# Patient Record
Sex: Male | Born: 1990
Health system: Southern US, Community
[De-identification: ages and names within clinical notes are randomized; demographics above are authoritative.]

## PROBLEM LIST (undated history)

## (undated) DIAGNOSIS — J342 Deviated nasal septum: Secondary | ICD-10-CM

## (undated) HISTORY — DX: Deviated nasal septum: J34.2

---
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
## 2015-10-05 DIAGNOSIS — F3342 Major depressive disorder, recurrent, in full remission: Secondary | ICD-10-CM | POA: Diagnosis not present

## 2016-01-06 DIAGNOSIS — Z23 Encounter for immunization: Secondary | ICD-10-CM | POA: Diagnosis not present

## 2016-03-28 DIAGNOSIS — F3342 Major depressive disorder, recurrent, in full remission: Secondary | ICD-10-CM | POA: Diagnosis not present

## 2016-10-25 DIAGNOSIS — F3342 Major depressive disorder, recurrent, in full remission: Secondary | ICD-10-CM | POA: Diagnosis not present

## 2017-02-23 DIAGNOSIS — L819 Disorder of pigmentation, unspecified: Secondary | ICD-10-CM | POA: Diagnosis not present

## 2017-02-23 DIAGNOSIS — F3342 Major depressive disorder, recurrent, in full remission: Secondary | ICD-10-CM | POA: Diagnosis not present

## 2017-02-23 DIAGNOSIS — B354 Tinea corporis: Secondary | ICD-10-CM | POA: Diagnosis not present

## 2017-04-11 DIAGNOSIS — F3342 Major depressive disorder, recurrent, in full remission: Secondary | ICD-10-CM | POA: Diagnosis not present

## 2017-09-06 DIAGNOSIS — Z0000 Encounter for general adult medical examination without abnormal findings: Secondary | ICD-10-CM | POA: Diagnosis not present

## 2017-09-20 DIAGNOSIS — R740 Nonspecific elevation of levels of transaminase and lactic acid dehydrogenase [LDH]: Secondary | ICD-10-CM | POA: Diagnosis not present

## 2017-09-25 ENCOUNTER — Other Ambulatory Visit: Payer: Self-pay | Admitting: Family Medicine

## 2017-09-25 DIAGNOSIS — R7401 Elevation of levels of liver transaminase levels: Secondary | ICD-10-CM

## 2017-09-25 DIAGNOSIS — R740 Nonspecific elevation of levels of transaminase and lactic acid dehydrogenase [LDH]: Principal | ICD-10-CM

## 2017-10-17 ENCOUNTER — Ambulatory Visit
Admission: RE | Admit: 2017-10-17 | Discharge: 2017-10-17 | Disposition: A | Discharge status: Home / Self Care | Payer: BLUE CROSS/BLUE SHIELD | Source: Ambulatory Visit | Attending: Family Medicine | Admitting: Family Medicine

## 2017-10-17 DIAGNOSIS — R740 Nonspecific elevation of levels of transaminase and lactic acid dehydrogenase [LDH]: Principal | ICD-10-CM

## 2017-10-17 DIAGNOSIS — R7401 Elevation of levels of liver transaminase levels: Secondary | ICD-10-CM

## 2017-11-26 DIAGNOSIS — J209 Acute bronchitis, unspecified: Secondary | ICD-10-CM | POA: Diagnosis not present

## 2018-03-10 DIAGNOSIS — F3342 Major depressive disorder, recurrent, in full remission: Secondary | ICD-10-CM | POA: Insufficient documentation

## 2018-03-10 DIAGNOSIS — F900 Attention-deficit hyperactivity disorder, predominantly inattentive type: Secondary | ICD-10-CM

## 2018-04-11 ENCOUNTER — Encounter: Payer: Self-pay | Admitting: Psychiatry

## 2018-04-11 ENCOUNTER — Ambulatory Visit: Payer: BLUE CROSS/BLUE SHIELD | Admitting: Psychiatry

## 2018-04-11 VITALS — BP 129/77 | HR 72 | Ht 74.0 in | Wt 200.0 lb

## 2018-04-11 DIAGNOSIS — F3342 Major depressive disorder, recurrent, in full remission: Secondary | ICD-10-CM

## 2018-04-11 DIAGNOSIS — F411 Generalized anxiety disorder: Secondary | ICD-10-CM | POA: Diagnosis not present

## 2018-04-11 MED ORDER — BUPROPION HCL 75 MG PO TABS: 75.0000 mg | ORAL_TABLET | Freq: Every day | ORAL | 1 refills | Status: DC

## 2018-04-11 NOTE — Progress Notes (Signed)
[facility]  Patient ID: ARMANDO MCEVOY,  MRN: [id number]  PCP: Iva Boop, MD  Date of Evaluation: 04/11/2018 Time spent:10 minutes  Chief Complaint:  Chief Complaint    Anxiety; Depression; Follow-up      HISTORY/CURRENT STATUS: Stanford Breed is seen individually face-to-face with consent not collateral for psychiatric interview and exam in 1 year evaluation and management of recurrent major depression in remission with previous provisional differential diagnosis of inattentive ADHD.  In the interim, patient is observing and now reporting that he has frequent anxiety experienced as a tightness in the chest with excessive sweating and uneasy apprehension that something will go wrong.  He is taking on more responsibility at work and very busy.  He has experienced the graduation of his wife from [employer] now working since August so that they are in position to consider a pregnancy for next year.  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  However he does allow discussion today of the option of 150 mg XL Wellbutrin or the addition of low dose Lexapro to his Wellbutrin to optimize coverage and relief of anxiety.  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  Private life is polished and maintained toward wellness, happiness, and rewarding relationships, activities, and employment including with family.  As in past, he refuses any medication changes currently. Anxiety  Presents for initial visit. Onset was more than 5 years ago. The problem has been waxing and waning. Symptoms include chest pain, decreased concentration, dry mouth, excessive worry, muscle tension, nervous/anxious behavior, palpitations and shortness of breath. Patient reports no suicidal ideas. Primary symptoms comment:  Diaphoresis. Symptoms occur most days. The severity of symptoms is moderate. The symptoms are aggravated by work stress and social activities. The patient sleeps 6 hours per night. The quality of sleep is fair.   Risk factors include family history and a major life event. His past medical history is significant for anxiety/panic attacks and depression. There is no history of bipolar disorder or suicide attempts. Past treatments include non-SSRI antidepressants and lifestyle changes. The treatment provided mild relief. Compliance with prior treatments has been variable. Prior compliance problems include medication issues and difficulty with treatment plan.    Individual Medical History/ Review of Systems: Changes? :No   Allergies: Penicillins  Current Medications:  Current Outpatient Medications:  .  buPROPion (WELLBUTRIN) 75 MG tablet, Take 75 mg by mouth every morning., Disp: , Rfl:  Medication Side Effects: none  Family Medical/ Social History: Changes? No  MENTAL HEALTH EXAM: Muscle strengths and tone 5/5, postural reflexes and gait 0/0, and AIMS = 0. Blood pressure 129/77, pulse 72, height 6\' 2"  (1.88 m), weight 200 lb (90.7 kg).Body mass index is 25.68 kg/m.  General Appearance: Casual, Guarded and Obese and meticulous  Eye Contact:  Good  Speech:  Blocked and Clear and Coherent  Volume:  Normal  Mood:  Anxious, Euthymic and Worthless  Affect:  Constricted and Anxious  Thought Process:  Goal Directed and Linear  Orientation:  Full (Time, Place, and Person)  Thought Content: Obsessions   Suicidal Thoughts:  No  Homicidal Thoughts:  No  Memory:  Immediate;   Good Remote;   Good  Judgement:  Good  Insight:  Fair  Psychomotor Activity:  Normal  Concentration:  Concentration: Fair and Attention Span: Fair  Recall:  Good  Fund of Knowledge: Good  Language: Good  Assets:  Leisure Time Physical Health Resilience  ADL's:  Intact  Cognition: WNL  Prognosis:  Good     DIAGNOSES:    ICD-10-CM   1. Major depressive disorder, recurrent episode, in full remission (HCC) F33.42   2. Generalized anxiety disorder F41.1     Receiving Psychotherapy: No    RECOMMENDATIONS: The patient's insight faciltates differential diagnosis clarification that inattention and distraction symptoms of the past may have been generalized anxiety rather than ADHD, including as a risk factor for history of depression.  Wellbutrin alone may help depression and focus more than anxiety, except generalized anxiety may be improved though not optimally with an 8-hour low-dose coverage.  We process comparison of 75 mg IR over 8 hours with 150 mg XL over 24 hours.  Marlene Bast is doing well with an effectively established life otherwise.  He is E scribed bupropion 75 mg IR daily after breakfast #90 with 1 refill sent to [facility] on [address].  He returns in 6 months to reassess anxiety and its treatment.   Chauncey Mann, MD

## 2018-10-10 ENCOUNTER — Encounter: Payer: Self-pay | Admitting: Psychiatry

## 2018-10-10 ENCOUNTER — Ambulatory Visit (INDEPENDENT_AMBULATORY_CARE_PROVIDER_SITE_OTHER): Payer: BC Managed Care – PPO | Admitting: Psychiatry

## 2018-10-10 ENCOUNTER — Other Ambulatory Visit: Payer: Self-pay

## 2018-10-10 VITALS — Ht 74.0 in | Wt 193.0 lb

## 2018-10-10 DIAGNOSIS — F3342 Major depressive disorder, recurrent, in full remission: Secondary | ICD-10-CM

## 2018-10-10 DIAGNOSIS — F411 Generalized anxiety disorder: Secondary | ICD-10-CM | POA: Diagnosis not present

## 2018-10-10 MED ORDER — BUPROPION HCL ER (XL) 150 MG PO TB24: 150.0000 mg | ORAL_TABLET | Freq: Every day | ORAL | 1 refills | Status: DC

## 2018-10-10 NOTE — Progress Notes (Signed)
[facility]  Patient ID: James Woodward,  MRN: [id number]  PCP: Dineen Kid, MD  Date of Evaluation: 10/10/2018 Time spent:10 minutes from 0905 to 0915 Chief Complaint:  Chief Complaint    Depression; Anxiety; Stress      HISTORY/CURRENT STATUS: James Woodward is seen onsite in office face-to-face with consent individually with epic collateral for psychiatric interview and exam in 70-month evaluation and management of depression and anxiety.  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  He returns in 6 instead of 12 months from last appointment when he had some brief consideration of the extended release Wellbutrin and doubling the dose but refused to make any changes in tax season.  He again complains of more anxiety clarifying some OCD symptoms that concern wife but are more clinically cluster C traits.  Wife attempts to assist him with diet and exercise components, though she is concerned he may have OCD, and he is most concerned about the pandemic of coronavirus.  He presents preferring Wellbutrin increase discissed the last appointment that he declines then.  He declines today to consider Lexapro, Prozac, or Neurontin, suggesting he is taking Neurontin in the past for headaches.  He has no suicidality, delirium, mania, or psychosis.   Anxiety        Presents for increased symptoms of onset was more than 5 years ago. The problem has been waxing and waning. Symptoms include chest pain, decreased concentration, dry mouth, excessive worry, muscle tension, nervous/anxious behavior,increased eating, weight loss, palpitations and shortness of breath. Patient reports no suicidal ideas. Primary symptoms comment: Diaphoresis. Symptoms occur most days. The severity of symptoms is moderate. The symptoms are aggravated by work stress and social activities. The patient sleeps 6 hours per night. The quality of sleep is good.   Individual  Medical History/ Review of Systems: Changes? :No But he may have taken Neurontin in the past for headaches.  Allergies: Penicillins  Current Medications:  Current Outpatient Medications:  .  buPROPion (WELLBUTRIN XL) 150 MG 24 hr tablet, Take 1 tablet (150 mg total) by mouth daily after breakfast., Disp: 90 tablet, Rfl: 1   Medication Side Effects: none  Family Medical/ Social History: Changes? Yes wife as a therapist including yoga and holistic diet is concerned still that he may have OCD.  MENTAL HEALTH EXAM:  Height 6\' 2"  (1.88 m), weight 193 lb (87.5 kg).Body mass index is 24.78 kg/m.  As not present in office today  General Appearance: N/A  Eye Contact:  N/A  Speech:  Clear and Coherent, Normal Rate and Talkative  Volume:  Normal  Mood:  Anxious and Euthymic  Affect:  Congruent, Full Range and Anxious  Thought Process:  Coherent, Goal Directed and Irrelevant  Orientation:  Full (Time, Place, and Person)  Thought Content: Obsessions   Suicidal Thoughts:  No  Homicidal Thoughts:  No  Memory:  Immediate;   Good Remote;   Good  Judgement:  Good  Insight:  Fair  Psychomotor Activity:  Normal and Mannerisms  Concentration:  Concentration: Fair and Attention Span: Good  Recall:  Good  Fund of Knowledge: Good  Language: Good  Assets:  Desire for Improvement Physical Health Vocational/Educational  ADL's:  Intact  Cognition: WNL  Prognosis:  Good    DIAGNOSES:    ICD-10-CM   1. Generalized anxiety disorder  F41.1 buPROPion (WELLBUTRIN XL) 150 MG 24 hr tablet  2. Major depressive disorder, recurrent episode, in  full remission (HCC)  F33.42 buPROPion (WELLBUTRIN XL) 150 MG 24 hr tablet    Receiving Psychotherapy: No    RECOMMENDATIONS: Wellbutrin E scription is changed from the 75 mg IR every morning to 150 mg XL every morning sent as #90 with 1 refill to [facility] [address] for depression and anxiety.  He declines Lexapro, Prozac, or gabapentin at this time but he  agrees to follow-up in 6 months.  He allows reeducation on symptom treatment matching including for cognitive behavioral social skills, frustration management, and problem solving.  He has previously taken Wellbutrin up to 300 mg XL and tolerated well but tends to perfectionistically keep his dose as low as possible.    Virtual Visit via Video Note  I connected with James Woodward on 10/10/18 at  9:00 AM EDT by a video enabled tBertell Mariaelemedicine application and verified that I am speaking with the correct person using two identifiers.  Location: Patient: Individually at home residence Provider: [facility] office   I discussed the limitations of evaluation and management by telemedicine and the availability of in person appointments. The patient expressed understanding and agreed to proceed.  History of Present Illness: 6048-month evaluation and management address depression and anxiety.  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  He returns in 6 instead of 12 months from last appointment when he had some brief consideration of the extended release Wellbutrin and doubling the dose but refused to make any changes in tax season.    Observations/Objective: Mood:  Anxious and Euthymic  Affect:  Congruent, Full Range and Anxious  Thought Process:  Coherent, Goal Directed and Irrelevant  Orientation:  Full (Time, Place, and Person)  Thought Content: Obsessions    Assessment and Plan: Wellbutrin E scription is changed from the 75 mg IR every morning to 150 mg XL every morning sent as #90 with 1 refill to [facility] for depression and anxiety.  He declines Lexapro, Prozac, or gabapentin at this time b  Follow Up Instructions: He agrees to follow-up in 6 months.  He allows reeducation on symptom treatment matching including for cognitive behavioral social skills, frustration management, and problem solving.  He has  previously taken Wellbutrin up to 300 mg XL and tolerated well but tends to perfectionistically keep his dose as low as possible.     I discussed the assessment and treatment plan with the patient. The patient was provided an opportunity to ask questions and all were answered. The patient agreed with the plan and demonstrated an understanding of the instructions.   The patient was advised to call back or seek an in-person evaluation if the symptoms worsen or if the condition fails to improve as anticipated.  I provided 10 minutes of non-face-to-face time during this encounter. [employer] meeting #[phone] Meeting password:  [email or web address]  Chauncey MannGlenn E Fredonia Casalino, MD   Chauncey MannGlenn E Cornel Werber, MD

## 2018-10-12 ENCOUNTER — Other Ambulatory Visit: Payer: Self-pay | Admitting: Psychiatry

## 2018-10-12 DIAGNOSIS — F3342 Major depressive disorder, recurrent, in full remission: Secondary | ICD-10-CM

## 2018-10-12 DIAGNOSIS — F411 Generalized anxiety disorder: Secondary | ICD-10-CM

## 2018-10-18 DIAGNOSIS — R1030 Lower abdominal pain, unspecified: Secondary | ICD-10-CM | POA: Diagnosis not present

## 2018-10-18 DIAGNOSIS — R319 Hematuria, unspecified: Secondary | ICD-10-CM | POA: Diagnosis not present

## 2018-11-14 DIAGNOSIS — R319 Hematuria, unspecified: Secondary | ICD-10-CM | POA: Diagnosis not present

## 2018-12-13 DIAGNOSIS — Z23 Encounter for immunization: Secondary | ICD-10-CM | POA: Diagnosis not present

## 2018-12-13 DIAGNOSIS — R102 Pelvic and perineal pain: Secondary | ICD-10-CM | POA: Diagnosis not present

## 2019-03-26 DIAGNOSIS — Z0000 Encounter for general adult medical examination without abnormal findings: Secondary | ICD-10-CM | POA: Diagnosis not present

## 2019-04-06 ENCOUNTER — Other Ambulatory Visit: Payer: Self-pay | Admitting: Psychiatry

## 2019-04-06 DIAGNOSIS — F3342 Major depressive disorder, recurrent, in full remission: Secondary | ICD-10-CM

## 2019-04-06 DIAGNOSIS — F411 Generalized anxiety disorder: Secondary | ICD-10-CM

## 2019-07-04 ENCOUNTER — Other Ambulatory Visit: Payer: Self-pay | Admitting: Psychiatry

## 2019-07-04 DIAGNOSIS — F3342 Major depressive disorder, recurrent, in full remission: Secondary | ICD-10-CM

## 2019-07-04 DIAGNOSIS — F411 Generalized anxiety disorder: Secondary | ICD-10-CM

## 2019-07-07 NOTE — Telephone Encounter (Signed)
Last apt 10/10/2018

## 2019-10-03 ENCOUNTER — Other Ambulatory Visit: Payer: Self-pay | Admitting: Psychiatry

## 2019-10-03 DIAGNOSIS — F411 Generalized anxiety disorder: Secondary | ICD-10-CM

## 2019-10-03 DIAGNOSIS — F3342 Major depressive disorder, recurrent, in full remission: Secondary | ICD-10-CM

## 2019-10-03 NOTE — Telephone Encounter (Signed)
Patient has not been in since 09/2018, due back Feb 21

## 2019-11-06 ENCOUNTER — Other Ambulatory Visit: Payer: Self-pay | Admitting: Psychiatry

## 2019-11-06 DIAGNOSIS — F411 Generalized anxiety disorder: Secondary | ICD-10-CM

## 2019-11-06 DIAGNOSIS — F3342 Major depressive disorder, recurrent, in full remission: Secondary | ICD-10-CM

## 2019-11-06 NOTE — Telephone Encounter (Signed)
No follow up since 09/2018

## 2019-12-09 ENCOUNTER — Other Ambulatory Visit: Payer: Self-pay | Admitting: Psychiatry

## 2019-12-09 DIAGNOSIS — F411 Generalized anxiety disorder: Secondary | ICD-10-CM

## 2019-12-09 DIAGNOSIS — F3342 Major depressive disorder, recurrent, in full remission: Secondary | ICD-10-CM

## 2019-12-09 NOTE — Telephone Encounter (Signed)
Last apt over a year ago 09/2018

## 2019-12-17 ENCOUNTER — Encounter: Payer: Self-pay | Admitting: Psychiatry

## 2020-01-08 ENCOUNTER — Other Ambulatory Visit: Payer: Self-pay

## 2020-01-08 ENCOUNTER — Other Ambulatory Visit: Payer: Self-pay | Admitting: Psychiatry

## 2020-01-08 ENCOUNTER — Telehealth: Payer: Self-pay | Admitting: Psychiatry

## 2020-01-08 DIAGNOSIS — F411 Generalized anxiety disorder: Secondary | ICD-10-CM

## 2020-01-08 DIAGNOSIS — F3342 Major depressive disorder, recurrent, in full remission: Secondary | ICD-10-CM

## 2020-01-08 MED ORDER — BUPROPION HCL ER (XL) 150 MG PO TB24: ORAL_TABLET | ORAL | 0 refills | Status: DC

## 2020-01-08 NOTE — Telephone Encounter (Signed)
Pt left a message stating that he needs a refill on his wellbutrin xl 150 mg He has an appt on 11/16. At the [facility] on [address]

## 2020-01-08 NOTE — Telephone Encounter (Signed)
RX sent

## 2020-01-13 ENCOUNTER — Ambulatory Visit (INDEPENDENT_AMBULATORY_CARE_PROVIDER_SITE_OTHER): Payer: 59 | Admitting: Psychiatry

## 2020-01-13 ENCOUNTER — Encounter: Payer: Self-pay | Admitting: Psychiatry

## 2020-01-13 ENCOUNTER — Other Ambulatory Visit: Payer: Self-pay

## 2020-01-13 VITALS — Ht 74.0 in | Wt 203.0 lb

## 2020-01-13 DIAGNOSIS — F411 Generalized anxiety disorder: Secondary | ICD-10-CM | POA: Diagnosis not present

## 2020-01-13 DIAGNOSIS — F3342 Major depressive disorder, recurrent, in full remission: Secondary | ICD-10-CM

## 2020-01-13 MED ORDER — DESVENLAFAXINE SUCCINATE ER 50 MG PO TB24: 50.0000 mg | ORAL_TABLET | Freq: Every day | ORAL | 2 refills | Status: DC

## 2020-01-13 NOTE — Progress Notes (Signed)
[facility]  Patient ID: James Woodward,  MRN: [id number]  PCP: Iva Boop, MD  Date of Evaluation: 01/13/2020 Time spent:25 minutes from 0920 to 0945  Chief Complaint:  Chief Complaint    Anxiety; Panic Attack; Depression      HISTORY/CURRENT STATUS: James Woodward is seen onsite in office 25 minutes face-to-face individually with consent with epic collateral for psychiatric interview and exam in 29-month evaluation and management of generalized anxiety now also having panic attacks though with major depression remaining in remission, being 9 months overdue for follow up.  James Woodward reviews again the family history of mother having OCD, sister ADHD and social anxiety, and father depression.  James Woodward had a job change last winter approximately 5 months after last appointment calling for update renewal of his continued XL Wellbutrin started last August having always preferred the 75 mg IR prior to switching to the XL last appointment.  However despite the increase in dose of medication, he finds that wife delivering their first baby in July and several deaths of relatives in the interim have resulted in several panic attacks over the last 6 to 8 months which he manages with breathing skills and yoga and meditation.  However he now is willing to consider the change in medication to cover prevention of generalized anxiety now with panic as discussed in the past.  His limited symptom panic is more inherent to his generalized anxiety thus far, but emerging panic disorder is certainly to be considered.  Depression has not recurred again.  He has a several week supply of Wellbutrin from that phoned in as #30 on 01/08/2020.  As we review all options such as Lexapro or Prozac discussed last appointment being added to his Wellbutrin or the addition of Neurontin, he prefers to switch from Wellbutrin to a single agent we discuss such as Pristiq.  He is not manic, psychotic, delirious or  suicidal.   Anxiety Presents for follow-up visit. Symptoms include excessive worry, hyperventilation, muscle tension, nervous/anxious behavior, panic, restlessness and shortness of breath. Patient reports no confusion, decreased concentration, depressed mood, insomnia, irritability or suicidal ideas. Symptoms occur most days. The severity of symptoms is causing significant distress and moderate. The quality of sleep is good. Nighttime awakenings: none.   Compliance with medications is 51-75%.    Individual Medical History/ Review of Systems: Changes? :Yes  weight loss of 7 pounds at last appointment and is now back up to 10 pounds.  Allergies: Penicillins  Current Medications:  Current Outpatient Medications:  .  desvenlafaxine (PRISTIQ) 50 MG 24 hr tablet, Take 1 tablet (50 mg total) by mouth daily after breakfast., Disp: 90 tablet, Rfl: 2   Medication Side Effects: none  Family Medical/ Social History: Changes? No though noting again mother having OCD, sister ADHD and social anxiety, and father depression. Wife as provider of therapeutic yoga and holistic diet is concerned still that he may have OCD.  MENTAL HEALTH EXAM:  Height 6\' 2"  (1.88 m), weight 203 lb (92.1 kg).Body mass index is 26.06 kg/m. Muscle strengths and tone 5/5, postural reflexes and gait 0/0, and AIMS = 0.  General Appearance: Casual, Meticulous and Well Groomed  Eye Contact:  Good  Speech:  Clear and Coherent, Normal Rate and Talkative  Volume:  Normal  Mood:  Anxious and Euthymic  Affect:  Congruent, Full Range and Anxious  Thought Process:  Coherent, Goal Directed, Irrelevant and Descriptions of Associations: Circumstantial  Orientation:  Full (Time, Place, and Person)  Thought Content: Obsessions  and Rumination   Suicidal Thoughts:  No  Homicidal Thoughts:  No  Memory:  Immediate;   Good Remote;   Good  Judgement:  Good  Insight:  Fair  Psychomotor Activity:  Normal and Mannerisms  Concentration:   Concentration: Fair and Attention Span: Good  Recall:  Good  Fund of Knowledge: Good  Language: Good  Assets:  Desire for Improvement Physical Health Resilience Vocational/Educational  ADL's:  Intact  Cognition: WNL  Prognosis:  Good    DIAGNOSES:    ICD-10-CM   1. Generalized anxiety disorder  F41.1 desvenlafaxine (PRISTIQ) 50 MG 24 hr tablet  2. Major depressive disorder, recurrent episode, in full remission (HCC)  F33.42 desvenlafaxine (PRISTIQ) 50 MG 24 hr tablet    Receiving Psychotherapy: No    RECOMMENDATIONS: Panic attack density, severity and frequency suggest limited symptom panic of GAD rather than primary diagnosis of panic disorder.  Still the patient is now willing to restructure his medication management to address symptoms more comprehensively in addition to previous depression primarily treated by the retiring psychiatrist from before transferring here.  He does not clarify whether my imminent retirement and case closure for transfer to advanced practitioner in the office is contributing to his current long awaited decision.  He prefers to continue on Wellbutrin 150 mg XL every morning for up to 2 weeks completing remaining supply as he also starts the new medication to establish efficacy before discontinuing the Wellbutrin.  He is E scribed Pristiq 25 mg every morning sent as #90 with 2 refills to [facility] [address] for generalized anxiety and major depression.  He prefers follow-up in 6 months agreeing to see Corie Chiquito, PMHNP in this office though he states he will phone for any initial interim concerns about dosing discussed in symptom treatment matching.   Chauncey Mann, MD

## 2020-02-08 ENCOUNTER — Other Ambulatory Visit: Payer: Self-pay | Admitting: Psychiatry

## 2020-02-08 DIAGNOSIS — F411 Generalized anxiety disorder: Secondary | ICD-10-CM

## 2020-02-08 DIAGNOSIS — F3342 Major depressive disorder, recurrent, in full remission: Secondary | ICD-10-CM

## 2020-04-08 ENCOUNTER — Telehealth: Payer: Self-pay | Admitting: Psychiatry

## 2020-04-08 DIAGNOSIS — F411 Generalized anxiety disorder: Secondary | ICD-10-CM

## 2020-04-08 DIAGNOSIS — F3342 Major depressive disorder, recurrent, in full remission: Secondary | ICD-10-CM

## 2020-04-08 MED ORDER — DESVENLAFAXINE SUCCINATE ER 50 MG PO TB24: 50.0000 mg | ORAL_TABLET | Freq: Every day | ORAL | 1 refills | Status: DC

## 2020-04-08 NOTE — Telephone Encounter (Signed)
Appt is 07/12/20, trsfr from Dr. Marlyne Beards. May's Pristiq is only filled at [facility] at [address] [place], [place] and phone number is [phone]. They are the only pharmacy in this area that can fill it for 90 days.

## 2020-04-08 NOTE — Telephone Encounter (Signed)
Pt has been called

## 2020-07-12 ENCOUNTER — Encounter: Payer: Self-pay | Admitting: Psychiatry

## 2020-07-12 ENCOUNTER — Other Ambulatory Visit: Payer: Self-pay

## 2020-07-12 ENCOUNTER — Ambulatory Visit (INDEPENDENT_AMBULATORY_CARE_PROVIDER_SITE_OTHER): Payer: 59 | Admitting: Psychiatry

## 2020-07-12 DIAGNOSIS — F411 Generalized anxiety disorder: Secondary | ICD-10-CM | POA: Diagnosis not present

## 2020-07-12 DIAGNOSIS — F3342 Major depressive disorder, recurrent, in full remission: Secondary | ICD-10-CM

## 2020-07-12 MED ORDER — DESVENLAFAXINE SUCCINATE ER 50 MG PO TB24: 50.0000 mg | ORAL_TABLET | Freq: Every day | ORAL | 1 refills | Status: DC

## 2020-07-12 NOTE — Progress Notes (Signed)
ALBARAA SWINGLE [id number] 21-Feb-1991 29 y.o.  Subjective:   Patient ID:  ESTEL SCHOLZE is a 30 y.o. (DOB May 27, 1990) male.  Chief Complaint:  Chief Complaint  Patient presents with  . Follow-up    H/o depression and anxiety    HPI MOTTY BORIN presents to the office today for follow-up of depression and anxiety. Pt previously seen by Dr. Marlyne Beards and care is being transferred to this provider due to Dr. Marlyne Beards' retirement. He reports that he has some initial increased anxiety with switch to Pristiq and then this improved. He denies any recent panic attacks. He reports that "most days" anxiety is "pretty good " with occasional days of increased anxiety and worry. He reports that he has not had any worsening depression. He reports that he has occasional days where it is harder to get going, about 1-2 days a month. Energy and motivation are ok most days. He reports that he is generally very self-motivated. He reports adequate concentration. He reports adequate sleep. Appetite has been good. Exercises daily and this is helpful for his anxiety. Denies SI.    Has an almost 15 month old daughter. He changed jobs about 6 months before baby was born. Basby sleeps well most nights. 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 Wife is a Paramedic. They have been together for 13 years. Has been practicing karate.    Past Psychiatric Medication Trials: Wellbutrin  Pristiq  Review of Systems:  Review of Systems  Musculoskeletal: Negative for gait problem.  Neurological: Negative for tremors and headaches.  Psychiatric/Behavioral:       Please refer to HPI    Medications: I have reviewed the patient's current medications.  Current Outpatient Medications  Medication Sig Dispense Refill  . Multiple Vitamin (MULTIVITAMIN) tablet Take 1 tablet by mouth daily.    . Omega-3 Fatty Acids (FISH OIL) 1200 MG CAPS Take by mouth.    . desvenlafaxine (PRISTIQ) 50 MG  24 hr tablet Take 1 tablet (50 mg total) by mouth daily after breakfast. 90 tablet 1   No current facility-administered medications for this visit.    Medication Side Effects: Other: Possible vivid dreams  Allergies:  Allergies  Allergen Reactions  . Penicillins     History reviewed. No pertinent past medical history.  Past Medical History, Surgical history, Social history, and Family history were reviewed and updated as appropriate.   Please see review of systems for further details on the patient's review from today.   Objective:   Physical Exam:  There were no vitals taken for this visit.  Physical Exam Constitutional:      General: He is not in acute distress. Musculoskeletal:        General: No deformity.  Neurological:     Mental Status: He is alert and oriented to person, place, and time.     Coordination: Coordination normal.  Psychiatric:        Attention and Perception: Attention and perception normal. He does not perceive auditory or visual hallucinations.        Mood and Affect: Mood normal. Mood is not anxious or depressed. Affect is not labile, blunt, angry or inappropriate.        Speech: Speech normal.        Behavior: Behavior normal.        Thought Content: Thought content normal. Thought content is not paranoid or delusional. Thought content does not include homicidal or suicidal  ideation. Thought content does not include homicidal or suicidal plan.        Cognition and Memory: Cognition and memory normal.        Judgment: Judgment normal.     Comments: Insight intact     Lab Review:  No results found for: NA, K, CL, CO2, GLUCOSE, BUN, CREATININE, CALCIUM, PROT, ALBUMIN, AST, ALT, ALKPHOS, BILITOT, GFRNONAA, GFRAA  No results found for: WBC, RBC, HGB, HCT, PLT, MCV, MCH, MCHC, RDW, LYMPHSABS, MONOABS, EOSABS, BASOSABS  No results found for: POCLITH, LITHIUM   No results found for: PHENYTOIN, PHENOBARB, VALPROATE, CBMZ   .res Assessment: Plan:    Pt reports that he would like to continue Pristiq 50 mg po qd at this time since mood and anxiety s/s have been well controlled overall. Discussed cost of Pristiq and that Pristiq may be more affordable with [facility] discount. Will [facility].  Pt to follow-up in 6 months or sooner if clinically indicated.  Patient advised to contact office with any questions, adverse effects, or acute worsening in signs and symptoms.   Lamon was seen today for follow-up.  Diagnoses and all orders for this visit:  Generalized anxiety disorder -     desvenlafaxine (PRISTIQ) 50 MG 24 hr tablet; Take 1 tablet (50 mg total) by mouth daily after breakfast.  Major depressive disorder, recurrent episode, in full remission (HCC) -     desvenlafaxine (PRISTIQ) 50 MG 24 hr tablet; Take 1 tablet (50 mg total) by mouth daily after breakfast.     Please see After Visit Summary for patient specific instructions.  Future Appointments  Date Time Provider Department Center  01/10/2021  8:30 AM Corie Chiquito, PMHNP [facility] None    No orders of the defined types were placed in this encounter.   -------------------------------

## 2020-07-31 IMAGING — US US ABDOMEN LIMITED
2 series · 14 of 25 positions shown · non-contrast
Comparison: None.

CLINICAL DATA: Elevated bilirubin.  Transaminitis.

EXAM:
ULTRASOUND ABDOMEN LIMITED RIGHT UPPER QUADRANT

[Series 1: us abdomen limited · 0.23mm/px · 12 of 33 slices shown (1 of 2)]
[im 1/33]
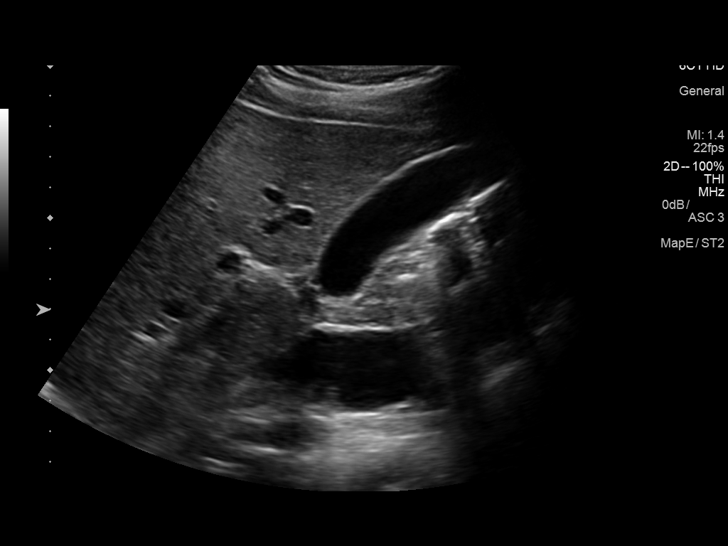
[im 4/33]
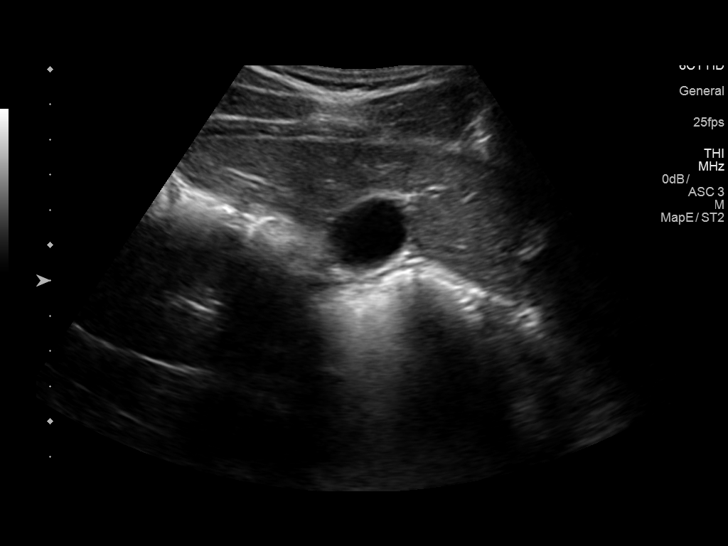
[im 7/33]
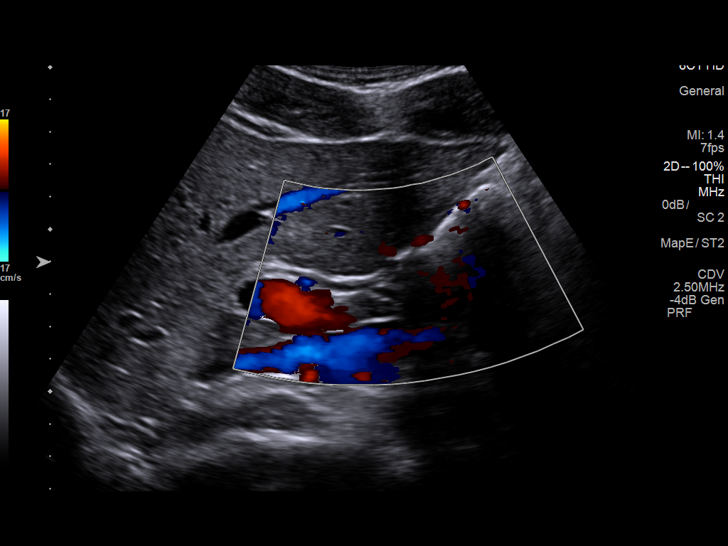
[im 10/33]
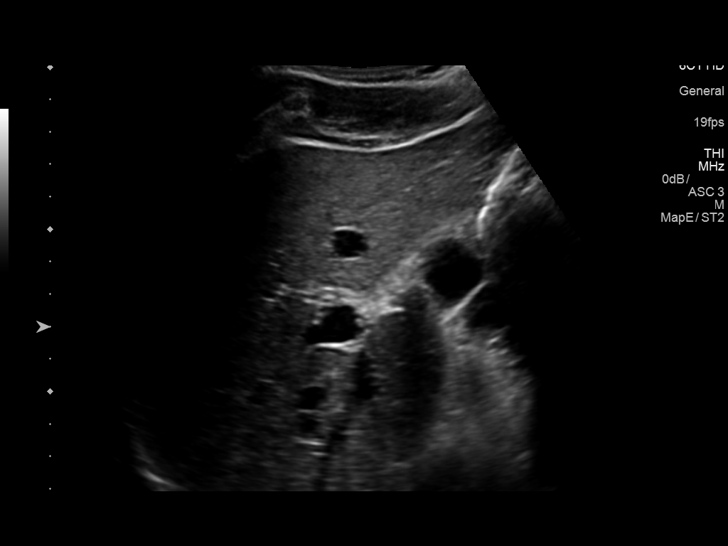
[im 13/33]
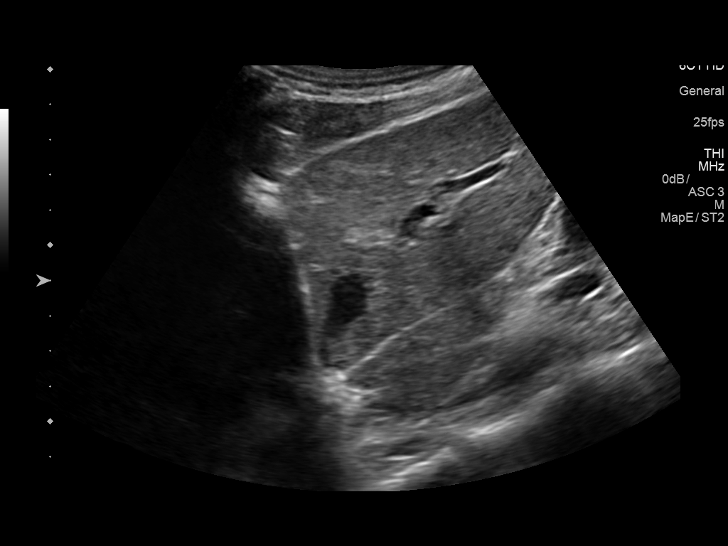
[im 14/33]
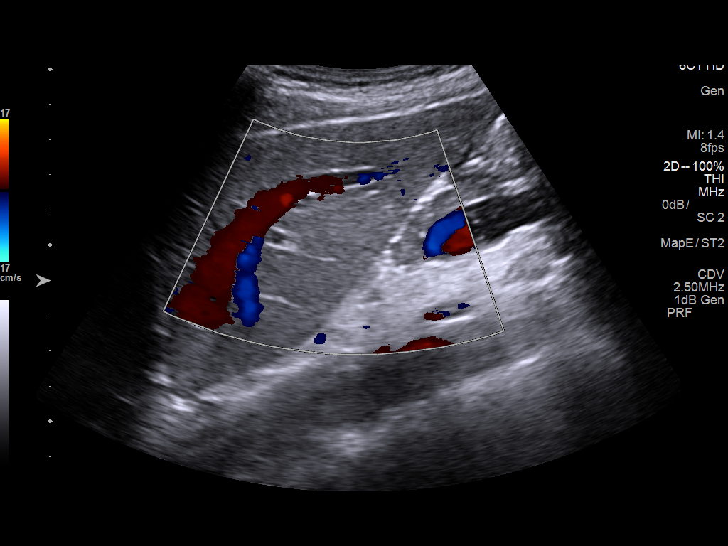
[im 17/33]
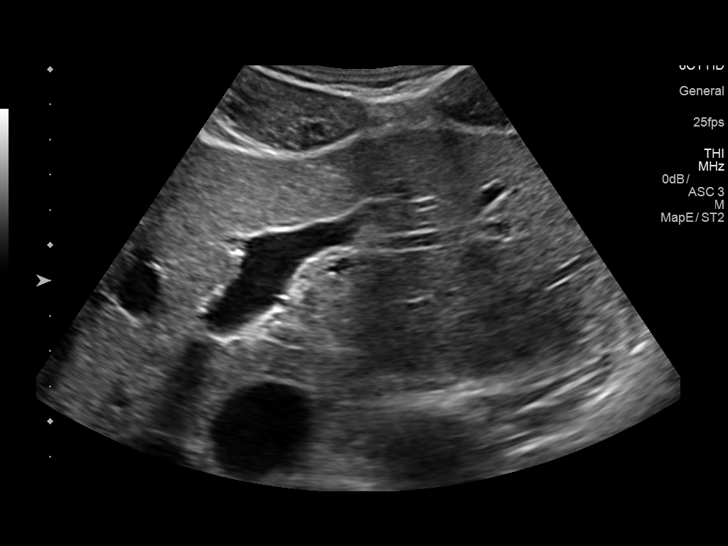
[im 20/33]
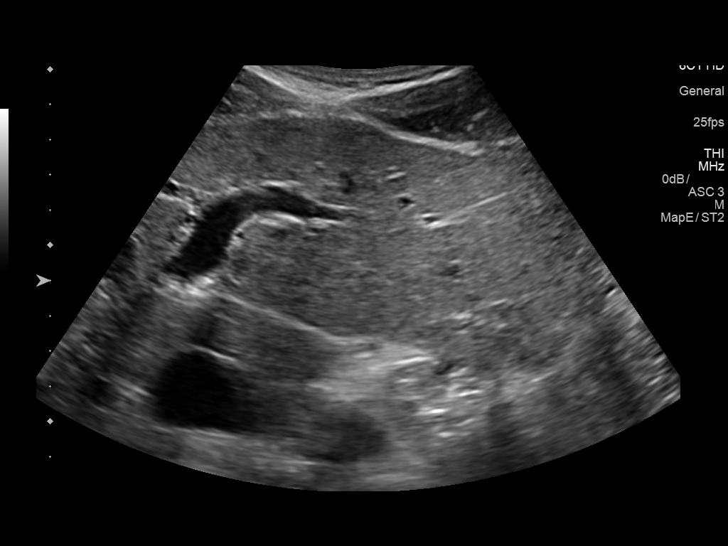
[im 23/33]
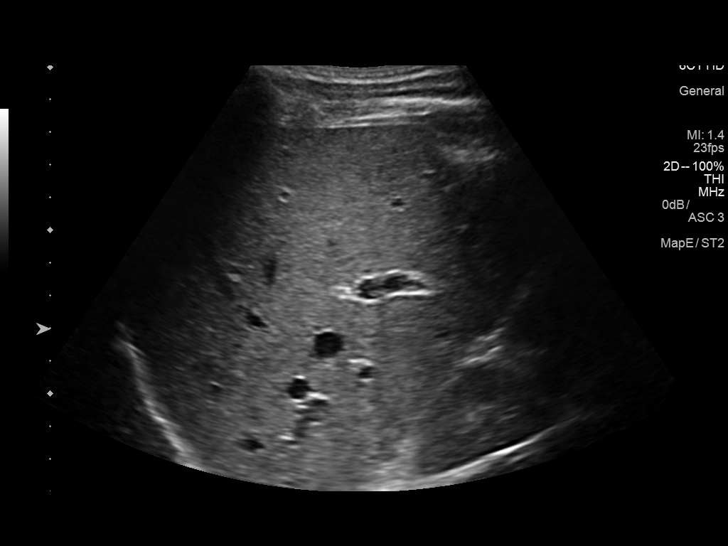
[im 25/33]
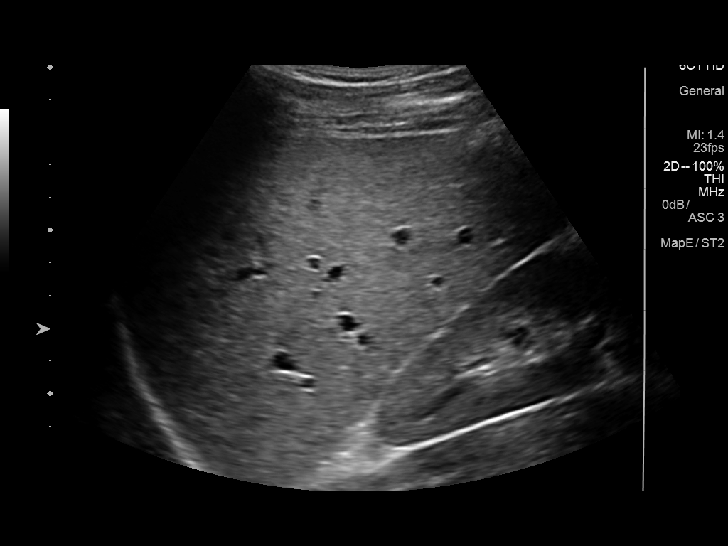
[im 28/33]
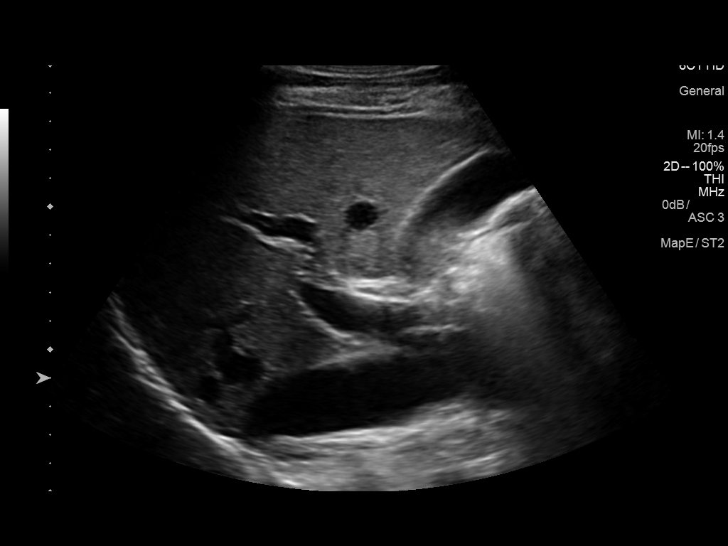
[im 31/33]
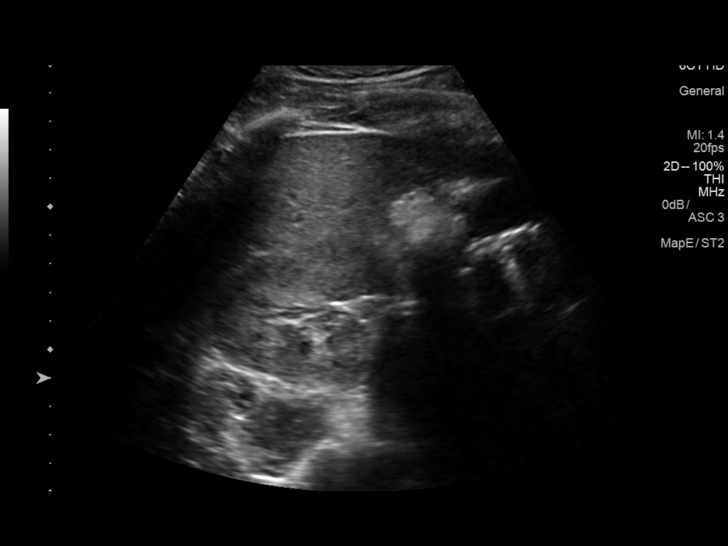

[Series 2001: us abdomen limited · 0.17mm/px · 2 of 4 slices shown (2 of 2)]
[im 1/4]
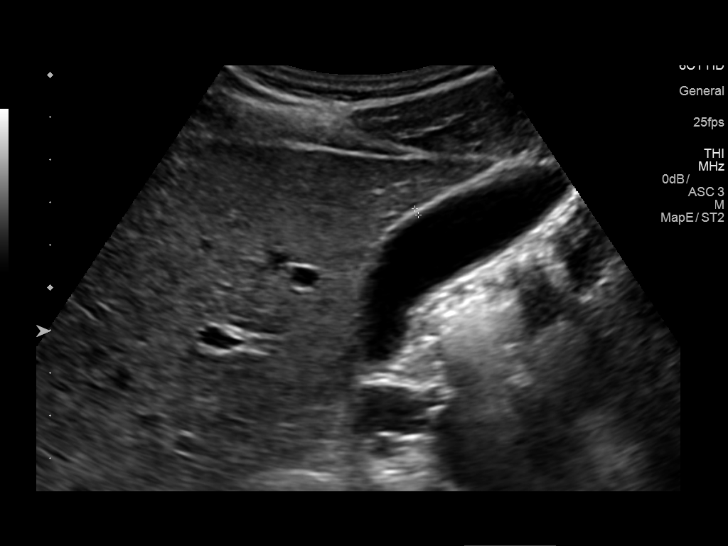
[im 4/4]
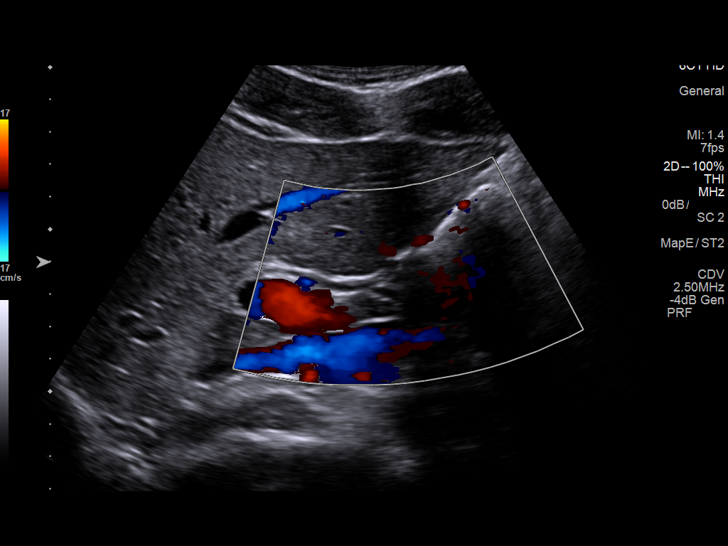

[14 of 25 positions shown; findings below may reference images not displayed]

FINDINGS: Gallbladder:

No gallstones or wall thickening visualized. No sonographic Murphy
sign noted by sonographer.

Common bile duct:

Diameter: 2.2 mm, normal

Liver:

No focal lesion identified. Within normal limits in parenchymal
echogenicity. Portal vein is patent on color Doppler imaging with
normal direction of blood flow towards the liver.
IMPRESSION: Normal exam.

## 2020-12-12 ENCOUNTER — Other Ambulatory Visit: Payer: Self-pay | Admitting: Psychiatry

## 2020-12-12 DIAGNOSIS — F3342 Major depressive disorder, recurrent, in full remission: Secondary | ICD-10-CM

## 2020-12-12 DIAGNOSIS — F411 Generalized anxiety disorder: Secondary | ICD-10-CM

## 2020-12-13 ENCOUNTER — Other Ambulatory Visit: Payer: Self-pay | Admitting: Psychiatry

## 2020-12-13 DIAGNOSIS — F3342 Major depressive disorder, recurrent, in full remission: Secondary | ICD-10-CM

## 2020-12-13 DIAGNOSIS — F411 Generalized anxiety disorder: Secondary | ICD-10-CM

## 2020-12-14 NOTE — Telephone Encounter (Signed)
Just clarification I sent refill yesterday for regular Pristiq 50 mg now today refill request for the ER? Thanks

## 2021-01-10 ENCOUNTER — Encounter: Payer: Self-pay | Admitting: Psychiatry

## 2021-01-10 ENCOUNTER — Other Ambulatory Visit: Payer: Self-pay

## 2021-01-10 ENCOUNTER — Ambulatory Visit (INDEPENDENT_AMBULATORY_CARE_PROVIDER_SITE_OTHER): Payer: 59 | Admitting: Psychiatry

## 2021-01-10 DIAGNOSIS — F411 Generalized anxiety disorder: Secondary | ICD-10-CM

## 2021-01-10 DIAGNOSIS — F3342 Major depressive disorder, recurrent, in full remission: Secondary | ICD-10-CM

## 2021-01-10 MED ORDER — DESVENLAFAXINE SUCCINATE ER 50 MG PO TB24: 50.0000 mg | ORAL_TABLET | Freq: Every day | ORAL | 1 refills | Status: DC

## 2021-01-10 NOTE — Progress Notes (Signed)
James Woodward [id number] [birth year] 30 y.o.  Subjective:   Patient ID:  James Woodward is a 30 y.o. (DOB 03/05/1990) male.  Chief Complaint:  Chief Complaint  Patient presents with   Follow-up    Anxiety and Depression    HPI James Woodward presents to the office today for follow-up of anxiety and depression. He reports that he is occasionally more easily "perturbed" than normal and may say things he wouldn't typically say. He reports that this is infrequent. He is unsure if this is related to medication, anxiety, or something else. Denies excessive worry. He reports that his anxiety manifests as feeling overwhelmed. Denies depressed mood. Sleeping ok. Occ waking up and hearing a song in his head. Appetite has been good. Energy and motivation have been ok. Occ challenges with concentration due to competing demands. Denies anhedonia. He reports that his wife will intentionally plan something to look forward to and then he will look forward to that. Denies SI.   He reports some OCD tendencies. He will feel compelled to do things, such as straightening and organizing things. He reports that he does not like things in disarray. He reports some routines and rituals, ie. Exercise has to come first things in the morning. Denies excessive intrusive thoughts.   Daughter is 9 months old and walking now. Work has been going well. Wife was previously working part-time and now is not working. Wife is now needing a break when he gets home. Continuing to do karate and exercise daily which is helpful for his stress.  Uses a pre-workout supplement with minimal caffeine intake. Does not drink caffeine.    Past Psychiatric Medication Trials: Wellbutrin  Pristiq  Review of Systems:  Review of Systems  Gastrointestinal: Negative.   Musculoskeletal:  Negative for gait problem.  Neurological:  Negative for headaches.  Psychiatric/Behavioral:         Please refer to HPI   Medications: I have reviewed the  patient's current medications.  Current Outpatient Medications  Medication Sig Dispense Refill   Multiple Vitamin (MULTIVITAMIN) tablet Take 1 tablet by mouth daily.     Omega-3 Fatty Acids (FISH OIL) 1200 MG CAPS Take by mouth.     desvenlafaxine (PRISTIQ) 50 MG 24 hr tablet Take 1 tablet (50 mg total) by mouth daily. 90 tablet 1   No current facility-administered medications for this visit.    Medication Side Effects: Other: Possible increased frustration  Allergies:  Allergies  Allergen Reactions   Penicillins     History reviewed. No pertinent past medical history.  Past Medical History, Surgical history, Social history, and Family history were reviewed and updated as appropriate.   Please see review of systems for further details on the patient's review from today.   Objective:   Physical Exam:  There were no vitals taken for this visit.  Physical Exam Constitutional:      General: He is not in acute distress. Musculoskeletal:        General: No deformity.  Neurological:     Mental Status: He is alert and oriented to person, place, and time.     Coordination: Coordination normal.  Psychiatric:        Attention and Perception: Attention and perception normal. He does not perceive auditory or visual hallucinations.        Mood and Affect: Mood normal. Mood is not anxious or depressed. Affect is not labile, blunt, angry or inappropriate.        Speech: Speech normal.  Behavior: Behavior normal.        Thought Content: Thought content normal. Thought content is not paranoid or delusional. Thought content does not include homicidal or suicidal ideation. Thought content does not include homicidal or suicidal plan.        Cognition and Memory: Cognition and memory normal.        Judgment: Judgment normal.     Comments: Insight intact    Lab Review:  No results found for: NA, K, CL, CO2, GLUCOSE, BUN, CREATININE, CALCIUM, PROT, ALBUMIN, AST, ALT, ALKPHOS, BILITOT,  GFRNONAA, GFRAA  No results found for: WBC, RBC, HGB, HCT, PLT, MCV, MCH, MCHC, RDW, LYMPHSABS, MONOABS, EOSABS, BASOSABS  No results found for: POCLITH, LITHIUM   No results found for: PHENYTOIN, PHENOBARB, VALPROATE, CBMZ   .res Assessment: Plan:   Pt seen for 30 minutes and time spent discussing obsessions and compulsions and indications for change in treatment. He reports that current s/s are mild, manageable, and not having a significant impact on his relationships or function.  Will continue Pristiq 50 mg po qd for anxiety and depression.  Pt to follow-up in 6 months or sooner if clinically indicated. Patient advised to contact office with any questions, adverse effects, or acute worsening in signs and symptoms.  James Woodward was seen today for follow-up.  Diagnoses and all orders for this visit:  Generalized anxiety disorder -     desvenlafaxine (PRISTIQ) 50 MG 24 hr tablet; Take 1 tablet (50 mg total) by mouth daily.  Major depressive disorder, recurrent episode, in full remission (HCC) -     desvenlafaxine (PRISTIQ) 50 MG 24 hr tablet; Take 1 tablet (50 mg total) by mouth daily.    Please see After Visit Summary for patient specific instructions.  Future Appointments  Date Time Provider Department Center  07/11/2021  8:30 AM Corie Chiquito, PMHNP [facility] None    No orders of the defined types were placed in this encounter.   -------------------------------

## 2021-07-11 ENCOUNTER — Telehealth (INDEPENDENT_AMBULATORY_CARE_PROVIDER_SITE_OTHER): Payer: 59 | Admitting: Psychiatry

## 2021-07-11 ENCOUNTER — Encounter: Payer: Self-pay | Admitting: Psychiatry

## 2021-07-11 DIAGNOSIS — F3342 Major depressive disorder, recurrent, in full remission: Secondary | ICD-10-CM

## 2021-07-11 DIAGNOSIS — F411 Generalized anxiety disorder: Secondary | ICD-10-CM

## 2021-07-11 MED ORDER — DESVENLAFAXINE SUCCINATE ER 50 MG PO TB24: 50.0000 mg | ORAL_TABLET | Freq: Every day | ORAL | 1 refills | Status: DC

## 2021-07-11 NOTE — Progress Notes (Signed)
James Woodward ?[id number] ?10/27/1990 ?31 y.o. ? ?Virtual Visit via Video Note ? ?I connected with pt @ on 07/11/21 at  8:30 AM EDT by a video enabled telemedicine application and verified that I am speaking with the correct person using two identifiers. ?  ?I discussed the limitations of evaluation and management by telemedicine and the availability of in person appointments. The patient expressed understanding and agreed to proceed. ? ?I discussed the assessment and treatment plan with the patient. The patient was provided an opportunity to ask questions and all were answered. The patient agreed with the plan and demonstrated an understanding of the instructions. ?  ?The patient was advised to call back or seek an in-person evaluation if the symptoms worsen or if the condition fails to improve as anticipated. ? ?I provided 30 minutes of non-face-to-face time during this encounter.  The patient was located at home.  The provider was located at home in [place]. ? ? ?Corie Chiquito, PMHNP  ? ?Subjective:  ? ?Patient ID:  James Woodward is a 31 y.o. (DOB 1991-02-25) male. ? ?Chief Complaint:  ?Chief Complaint  ?Patient presents with  ? Follow-up  ?  Anxiety and depression  ? ? ?HPI ?[name] presents for follow-up of anxiety and depression. He denies any major changes in the last 6 months.  ? ?He reports that his wife commented that he was not angry in a situation where she expected him to be angry. He reports that this has been long-standing and may be more so with medication. He reports that affective dulling has only been noticed once during a conversation. He reports that he tends to be self-critical and perfectionistic. He reports that he prefers routines. Denies any checking behaviors. Denies any compulsive behaviors. He reports less worry and anxious thoughts. Denies any major panic attacks. Denies consistent depression. He reports occ mild changes in mood, such as when his routine is changed. He reports that his  current routine of regular exercise helps his mood and energy. He reports that he has been able to relax more, even when there are some tasks that he normally would feel compelled to complete. Sleeping well. He reports that he may "not sleep enough" and averages 5.5-6 hours. He reports that he is "not prioritizing sleep as much as I should." Appetite has been good. Energy is good throughout the day when he is able to exercise. He notices some fatigue later in the day over the last month and this might be allergy related. Concentration has been ok. Denies SI.  ? ?Has been waking up with a "random song" in his head about 3-4 days a week since change of medication and it quickly leaves.  ? ?Upcoming trip to [facility]. ? ?Has been able to exercise regularly in the morning. Wife is a Marine scientist.  ? ?Past Psychiatric Medication Trials: ?Wellbutrin  ?Pristiq ?  ? ?Review of Systems:  ?Review of Systems  ?Musculoskeletal:  Negative for gait problem.  ?Skin:   ?     Had mole removed that was benign  ?Allergic/Immunologic: Positive for environmental allergies.  ?Psychiatric/Behavioral:    ?     Please refer to HPI  ? ?Medications: I have reviewed the patient's current medications. ? ?Current Outpatient Medications  ?Medication Sig Dispense Refill  ? Creatinine POWD by Does not apply route.    ? Omega-3 Fatty Acids (FISH OIL) 1200 MG CAPS Take by mouth.    ? desvenlafaxine (PRISTIQ) 50 MG 24 hr  tablet Take 1 tablet (50 mg total) by mouth daily. 90 tablet 1  ? Multiple Vitamin (MULTIVITAMIN) tablet Take 1 tablet by mouth daily. (Patient not taking: Reported on 07/11/2021)    ? ?No current facility-administered medications for this visit.  ? ? ?Medication Side Effects: Other: Possible affective dulling ? ?Allergies:  ?Allergies  ?Allergen Reactions  ? Penicillins   ? ? ?History reviewed. No pertinent past medical history. ? ?Family History  ?Problem Relation Age of Onset  ? OCD Mother   ? Depression Mother   ? Anxiety  disorder Maternal Aunt   ? ? ?Social History  ? ?Socioeconomic History  ? Marital status: Single  ?  Spouse name: Not on file  ? Number of children: Not on file  ? Years of education: Not on file  ? Highest education level: Not on file  ?Occupational History  ? Not on file  ?Tobacco Use  ? Smoking status: Never  ? Smokeless tobacco: Never  ?Vaping Use  ? Vaping Use: Never used  ?Substance and Sexual Activity  ? Alcohol use: Not Currently  ? Drug use: Never  ? Sexual activity: Yes  ?  Partners: Female  ?  Birth control/protection: Condom  ?Other Topics Concern  ? Not on file  ?Social History Narrative  ? Not on file  ? ?Social Determinants of Health  ? ?Financial Resource Strain: Not on file  ?Food Insecurity: Not on file  ?Transportation Needs: Not on file  ?Physical Activity: Not on file  ?Stress: Not on file  ?Social Connections: Not on file  ?Intimate Partner Violence: Not on file  ? ? ?Past Medical History, Surgical history, Social history, and Family history were reviewed and updated as appropriate.  ? ?Please see review of systems for further details on the patient's review from today.  ? ?Objective:  ? ?Physical Exam:  ?There were no vitals taken for this visit. ? ?Physical Exam ?Neurological:  ?   Mental Status: He is alert and oriented to person, place, and time.  ?   Cranial Nerves: No dysarthria.  ?Psychiatric:     ?   Attention and Perception: Attention and perception normal.     ?   Mood and Affect: Mood normal.     ?   Speech: Speech normal.     ?   Behavior: Behavior is cooperative.     ?   Thought Content: Thought content normal. Thought content is not paranoid or delusional. Thought content does not include homicidal or suicidal ideation. Thought content does not include homicidal or suicidal plan.     ?   Cognition and Memory: Cognition and memory normal.     ?   Judgment: Judgment normal.  ?   Comments: Insight intact  ? ? ?Lab Review:  ?No results found for: NA, K, CL, CO2, GLUCOSE, BUN,  CREATININE, CALCIUM, PROT, ALBUMIN, AST, ALT, ALKPHOS, BILITOT, GFRNONAA, GFRAA ? ?No results found for: WBC, RBC, HGB, HCT, PLT, MCV, MCH, MCHC, RDW, LYMPHSABS, MONOABS, EOSABS, BASOSABS ? ?No results found for: POCLITH, LITHIUM  ? ?No results found for: PHENYTOIN, PHENOBARB, VALPROATE, CBMZ  ? ?.res ?Assessment: Plan:   ?Pt seen for 30 minutes and time spent discussing possible affective dulling with medication. Discussed that affective dulling can be a side effect with antidepressants and is usually a dose-related side effect, ie. may occur at a higher dose and not with a lower dose of the same med or can worsen with dose increases. Discussed that generally SNRI's,  such as Pristiq, are less likely to cause affective dulling compared to [facility] and that usually affective dulling affects positive and negative emotions. Discussed that dose reductions can occasionally improve affective dulling and reducing Pristiq to 25 mg would be an option. He reports that he will monitor for possible affective dulling over the next 6 months. He reports that he prefers to continue current treatment plan at this time, particularly with upcoming vacation, since benefits are outweighing possible side effects.  ?Pt to follow-up in 6 months or sooner if clinically indicated.  ?Patient advised to contact office with any questions, adverse effects, or acute worsening in signs and symptoms. ? ? ?Jonny RuizJohn was seen today for follow-up. ? ?Diagnoses and all orders for this visit: ? ?Generalized anxiety disorder ?-     desvenlafaxine (PRISTIQ) 50 MG 24 hr tablet; Take 1 tablet (50 mg total) by mouth daily. ? ?Major depressive disorder, recurrent episode, in full remission (HCC) ?-     desvenlafaxine (PRISTIQ) 50 MG 24 hr tablet; Take 1 tablet (50 mg total) by mouth daily. ? ?  ? ?Please see After Visit Summary for patient specific instructions. ? ?Future Appointments  ?Date Time Provider Department Center  ?01/11/2022  8:30 AM Corie Chiquitoarter, Nichola Warren,  PMHNP [facility] None  ? ? ? ?No orders of the defined types were placed in this encounter. ? ? ?  ?-------------------------------  ?

## 2021-07-20 ENCOUNTER — Encounter: Payer: Self-pay | Admitting: Psychiatry

## 2022-01-11 ENCOUNTER — Ambulatory Visit (INDEPENDENT_AMBULATORY_CARE_PROVIDER_SITE_OTHER): Payer: 59 | Admitting: Psychiatry

## 2022-01-11 ENCOUNTER — Encounter: Payer: Self-pay | Admitting: Psychiatry

## 2022-01-11 DIAGNOSIS — F3342 Major depressive disorder, recurrent, in full remission: Secondary | ICD-10-CM

## 2022-01-11 DIAGNOSIS — F411 Generalized anxiety disorder: Secondary | ICD-10-CM | POA: Diagnosis not present

## 2022-01-11 MED ORDER — DESVENLAFAXINE SUCCINATE ER 50 MG PO TB24: 50.0000 mg | ORAL_TABLET | Freq: Every day | ORAL | 1 refills | Status: DC

## 2022-01-11 NOTE — Progress Notes (Signed)
James Woodward [id number] 03-07-1990 31 y.o.  Subjective:   Patient ID:  James Woodward is a 30 y.o. (DOB February 07, 1991) male.  Chief Complaint:  Chief Complaint  Patient presents with   Follow-up    Anxiety and depression   Fatigue    Excessive daytime somnolence    HPI James Woodward presents to the office today for follow-up of anxiety and depression. He reports that anxiety has been "pretty good" overall. Denies panic attacks or excessive worry. Denies compulsions. He reports that he continues to notice perfectionistic tendencies. Denies excessive checking behaviors. "When I start out on something I want to make sure I get it right" and that it does not interfere with function. Denies depression. He reports that his motivation has been good. He reports feeling "a little sluggish." He reports that at the end of the day he will feel tired and have difficulty getting going again if he sits down. Denies excessive somnolence. Denies difficulty getting out of bed. He reports that he sometimes snores. Wife has observed snoring. He has not been told he is gasping for air. He reports that [employer] indicates that his pulse ox stays above 95 %. Epworth Sleepiness Score= 9. Neck size 17. Sleeping 6-6.5 hours a night. Denies difficulty falling or staying asleep.   He has difficulty with concentration if he has not exercised. Most days concentration is adequate. Appetite has been good. He has noticed as much affect dulling since last visit. He reports that he feels slightly dampened compared to when he was on Wellbutrin. He reports irritability is less since he has not been taking Wellbutrin. He reports that he notices excitement is slightly dampened. He reports that he feels a little more excited about this pregnancy compared to the first baby. Denies SI.   Continues to exercise daily. 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   He and his wife are expecting another baby. They are in  the process of home renovations to create additional space.   Past Psychiatric Medication Trials: Wellbutrin  Pristiq    Review of Systems:  Review of Systems  Gastrointestinal: Negative.   Musculoskeletal:  Negative for gait problem.  Neurological:  Negative for tremors and headaches.  Psychiatric/Behavioral:         Please refer to HPI    Medications: I have reviewed the patient's current medications.  Current Outpatient Medications  Medication Sig Dispense Refill   Creatinine POWD by Does not apply route.     Multiple Vitamin (MULTIVITAMIN) LIQD Take 5 mLs by mouth daily.     Omega-3 Fatty Acids (FISH OIL) 1200 MG CAPS Take by mouth.     desvenlafaxine (PRISTIQ) 50 MG 24 hr tablet Take 1 tablet (50 mg total) by mouth daily. 90 tablet 1   No current facility-administered medications for this visit.    Medication Side Effects: Other: Possible affective dulling. Occ song in his head upon awakening (this has been less recently)  Allergies:  Allergies  Allergen Reactions   Penicillins     Past Medical History:  Diagnosis Date   Deviated nasal septum     Past Medical History, Surgical history, Social history, and Family history were reviewed and updated as appropriate.   Please see review of systems for further details on the patient's review from today.   Objective:   Physical Exam:  Ht 6\' 2"  (1.88 m)   Wt 220 lb (99.8 kg)   BMI 28.25 kg/m  Physical Exam Constitutional:      General: He is not in acute distress. Musculoskeletal:        General: No deformity.  Neurological:     Mental Status: He is alert and oriented to person, place, and time.     Coordination: Coordination normal.  Psychiatric:        Attention and Perception: Attention and perception normal. He does not perceive auditory or visual hallucinations.        Mood and Affect: Mood normal. Mood is not anxious or depressed. Affect is not labile, blunt, angry or inappropriate.        Speech:  Speech normal.        Behavior: Behavior normal.        Thought Content: Thought content normal. Thought content is not paranoid or delusional. Thought content does not include homicidal or suicidal ideation. Thought content does not include homicidal or suicidal plan.        Cognition and Memory: Cognition and memory normal.        Judgment: Judgment normal.     Comments: Insight intact     Lab Review:  No results found for: "NA", "K", "CL", "CO2", "GLUCOSE", "BUN", "CREATININE", "CALCIUM", "PROT", "ALBUMIN", "AST", "ALT", "ALKPHOS", "BILITOT", "GFRNONAA", "GFRAA"  No results found for: "WBC", "RBC", "HGB", "HCT", "PLT", "MCV", "MCH", "MCHC", "RDW", "LYMPHSABS", "MONOABS", "EOSABS", "BASOSABS"  No results found for: "POCLITH", "LITHIUM"   No results found for: "PHENYTOIN", "PHENOBARB", "VALPROATE", "CBMZ"   .res Assessment: Plan:   Pt seen for 30 minutes and time spent discussing chronic excessive daytime somnolence and fatigue. Discussed that symptoms and reports of him snoring may be possible signs of sleep apnea. He reports that he would be interested in completing a home sleep study if possible to rule out sleep apnea.  Also discussed obtaining labs to rule out possible medical causes of fatigue. He reports that his previous PCP retired and he will look into establishing care with a new PCP.  Will continue Pristiq 50 mg po qd for depression and anxiety.  Pt to follow-up in 6 months or sooner if clinically indicated.  Patient advised to contact office with any questions, adverse effects, or acute worsening in signs and symptoms.   James Woodward was seen today for follow-up and fatigue.  Diagnoses and all orders for this visit:  Major depressive disorder, recurrent episode, in full remission (HCC) -     desvenlafaxine (PRISTIQ) 50 MG 24 hr tablet; Take 1 tablet (50 mg total) by mouth daily.  Generalized anxiety disorder -     desvenlafaxine (PRISTIQ) 50 MG 24 hr tablet; Take 1 tablet (50  mg total) by mouth daily.     Please see After Visit Summary for patient specific instructions.  Future Appointments  Date Time Provider Department Center  07/12/2022  8:30 AM Corie Chiquito, PMHNP [facility] None    No orders of the defined types were placed in this encounter.   -------------------------------

## 2022-01-13 ENCOUNTER — Telehealth: Payer: Self-pay | Admitting: Psychiatry

## 2022-01-13 NOTE — Telephone Encounter (Signed)
See Marilyn's note about sleep study referral.

## 2022-01-13 NOTE — Telephone Encounter (Signed)
Referral has been faxed to [facility] for a Home Sleep study.

## 2022-03-29 ENCOUNTER — Telehealth: Payer: Self-pay | Admitting: Psychiatry

## 2022-03-29 NOTE — Telephone Encounter (Signed)
Noted  

## 2022-03-29 NOTE — Telephone Encounter (Signed)
We sent a referral to [facility] in November to set up a Home Sleep study.  On 02/17/22 we were notified by [facility] that they had unable to reach the pt to set up the study.  I LM for An[place]in 02/23/22 to Contact [facility] for the sleep study or to let us know if he was not going to do the study.  I did not hear back from him.  We have not received results to the sleep study so I left another message 03/27/22 to again contact [facility] or to call the office to let us know what the status of the sleep study is.  I have heard back from the patient.  So be aware that he has probably not done this sleep study.

## 2022-03-29 NOTE — Telephone Encounter (Signed)
fyi

## 2022-07-12 ENCOUNTER — Telehealth: Payer: Self-pay | Admitting: Psychiatry

## 2022-07-12 ENCOUNTER — Ambulatory Visit (INDEPENDENT_AMBULATORY_CARE_PROVIDER_SITE_OTHER): Payer: 59 | Admitting: Psychiatry

## 2022-07-12 ENCOUNTER — Encounter: Payer: Self-pay | Admitting: Psychiatry

## 2022-07-12 DIAGNOSIS — F3342 Major depressive disorder, recurrent, in full remission: Secondary | ICD-10-CM

## 2022-07-12 DIAGNOSIS — F411 Generalized anxiety disorder: Secondary | ICD-10-CM | POA: Diagnosis not present

## 2022-07-12 MED ORDER — DESVENLAFAXINE SUCCINATE ER 50 MG PO TB24: 50.0000 mg | ORAL_TABLET | Freq: Every day | ORAL | 1 refills | Status: DC

## 2022-07-12 MED ORDER — L-METHYLFOLATE 15 MG PO TABS: 15.0000 mg | ORAL_TABLET | Freq: Every day | ORAL | 5 refills | Status: DC

## 2022-07-12 NOTE — Telephone Encounter (Signed)
[facility] sent PA request for Desvenlafaxine SuccinateER 50mg , see CMM

## 2022-07-12 NOTE — Progress Notes (Signed)
James Woodward [id number] [birth year] 31 y.o.  Subjective:   Patient ID:  James Woodward is a 32 y.o. (DOB [birth year]) male.  Chief Complaint:  Chief Complaint  Patient presents with   Follow-up    Depression, anxiety    HPI James Woodward presents to the office today for follow-up of anxiety and depression.   He and his wife had a son in March and baby is sleeping and eating well. Started renovations in late January and this is almost completed.   He reports feeling somewhat "overwhelmed." He reports, "I think I have done really well with it. I think my anxiety is under control with the medication." Denies any recent health related anxiety. He repots that he has occasional brief episodes of depression. Wife had some postpartum depression and this is improving. He reports that he has episodic depression that is typically not longer than a day with low energy and/or some irritability. He reports that he is still about to do what he normally does, although it requires effort. He repots that depressive episodes occur a few times a week. He reports that energy and motivation have been good and then feels really tired at the end of the day. Sleeping well. Denies difficulty falling or staying asleep. He reports that wife has not commented about snoring recently. Appetite has been good. Denies difficulty with concentration. Denies SI.   He reports that Sundays seem to be when mood changes.   He feels that all emotions are slightly dampened. He notices if he forgets to take his medication  They have a 2 yo daughter that is adjusting to new brother.   Work has been going well. Wife is home full-time.   Past Psychiatric Medication Trials: Wellbutrin  Pristiq    Review of Systems:  Review of Systems  Musculoskeletal:  Negative for gait problem.  Neurological:  Negative for headaches.  Psychiatric/Behavioral:         Please refer to HPI    Medications: I have reviewed the patient's current  medications.  Current Outpatient Medications  Medication Sig Dispense Refill   Creatinine POWD by Does not apply route.     L-Methylfolate 15 MG TABS Take 1 tablet (15 mg total) by mouth daily. 30 tablet 5   Multiple Vitamin (MULTIVITAMIN) LIQD Take 5 mLs by mouth daily.     Omega-3 Fatty Acids (FISH OIL) 1200 MG CAPS Take by mouth.     desvenlafaxine (PRISTIQ) 50 MG 24 hr tablet Take 1 tablet (50 mg total) by mouth daily. 90 tablet 1   No current facility-administered medications for this visit.    Medication Side Effects: Other: Some affective dulling  Allergies:  Allergies  Allergen Reactions   Penicillins     Past Medical History:  Diagnosis Date   Deviated nasal septum     Past Medical History, Surgical history, Social history, and Family history were reviewed and updated as appropriate.   Please see review of systems for further details on the patient's review from today.   Objective:   Physical Exam:  There were no vitals taken for this visit.  Physical Exam Constitutional:      General: He is not in acute distress. Musculoskeletal:        General: No deformity.  Neurological:     Mental Status: He is alert and oriented to person, place, and time.     Coordination: Coordination normal.  Psychiatric:        Attention and Perception: Attention  and perception normal. He does not perceive auditory or visual hallucinations.        Mood and Affect: Mood normal. Mood is not anxious or depressed. Affect is not labile, blunt, angry or inappropriate.        Speech: Speech normal.        Behavior: Behavior normal.        Thought Content: Thought content normal. Thought content is not paranoid or delusional. Thought content does not include homicidal or suicidal ideation. Thought content does not include homicidal or suicidal plan.        Cognition and Memory: Cognition and memory normal.        Judgment: Judgment normal.     Comments: Insight intact     Lab Review:   No results found for: "NA", "K", "CL", "CO2", "GLUCOSE", "BUN", "CREATININE", "CALCIUM", "PROT", "ALBUMIN", "AST", "ALT", "ALKPHOS", "BILITOT", "GFRNONAA", "GFRAA"  No results found for: "WBC", "RBC", "HGB", "HCT", "PLT", "MCV", "MCH", "MCHC", "RDW", "LYMPHSABS", "MONOABS", "EOSABS", "BASOSABS"  No results found for: "POCLITH", "LITHIUM"   No results found for: "PHENYTOIN", "PHENOBARB", "VALPROATE", "CBMZ"   .res Assessment: Plan:    Discussed MTHFR mutation and potential benefits, risks, and side effects of L-Methylfolate. Pt reports that a family member may have benefited from L-methylfolate and will talk with family members about family history. Will send script for L-methylfoate 15 mg daily to be put on file if pt decides he would like to start L-methlyfolate.  Pt reports that he would like to continue Pristiq 50 mg daily since he feels benefits are outweighing side effects.  Pt reports that he decided not to pursue sleep study since he has not had any recent sleep issues.  Pt to follow-up in 6 months or sooner if clinically indicated.    James Woodward was seen today for follow-up.  Diagnoses and all orders for this visit:  Major depressive disorder, recurrent episode, in full remission (HCC) -     L-Methylfolate 15 MG TABS; Take 1 tablet (15 mg total) by mouth daily. -     desvenlafaxine (PRISTIQ) 50 MG 24 hr tablet; Take 1 tablet (50 mg total) by mouth daily.  Generalized anxiety disorder -     desvenlafaxine (PRISTIQ) 50 MG 24 hr tablet; Take 1 tablet (50 mg total) by mouth daily.     Please see After Visit Summary for patient specific instructions.  Future Appointments  Date Time Provider Department Center  01/10/2023  8:30 AM James Woodward, PMHNP [facility] None    No orders of the defined types were placed in this encounter.   -------------------------------

## 2022-07-13 NOTE — Telephone Encounter (Signed)
Prior Approval received effective 06/13/2022 through 07/13/2023 with [facility] for Desvenlafaxine Succinate ER 50 mg

## 2022-08-14 ENCOUNTER — Telehealth: Payer: Self-pay | Admitting: Psychiatry

## 2022-08-14 NOTE — Telephone Encounter (Signed)
Pt called at 4:56p.  He was only able to get 30 days of  Desvenlafaxine.  He said the pharmacy told him the script was written for 30 days, but I checked and it's written for 90 and his insurance wants him to get 90 days.  Will you call back to the pharmacy and see why they can't/wont give him the other 60 days of pills.  [facility] on [address]  [phone]   Next appt 11/13

## 2022-08-14 NOTE — Telephone Encounter (Signed)
Pharmacy said they ran Rx thru insurance and they would only allow 30-day supply to local pharmacy. Patient says they require 90-day RF.  Patient paid $4.36 for a 30-day supply, would be more with [facility]. Patient will check with his insurance to verify 90-day fill, if he needs to use a certain pharmacy, or use mail order. He has medication now and it is not urgent.

## 2022-08-14 NOTE — Telephone Encounter (Signed)
Patient has been using [facility] because it was cheaper than his insurance. He will call pharmacy and check cost going thru insurance and let us know. He said insurance does require 90-day fill and we had sent Rx for 90 to use [facility] and they only filled 30 days.  Patient was worried about not being able to get refills outside an appt and I told him we could still send RF and we would get the situation rectified once he decides if he wants to use [facility] or his insurance.

## 2023-01-10 ENCOUNTER — Encounter: Payer: Self-pay | Admitting: Psychiatry

## 2023-01-10 ENCOUNTER — Telehealth (INDEPENDENT_AMBULATORY_CARE_PROVIDER_SITE_OTHER): Payer: 59 | Admitting: Psychiatry

## 2023-01-10 DIAGNOSIS — F330 Major depressive disorder, recurrent, mild: Secondary | ICD-10-CM | POA: Diagnosis not present

## 2023-01-10 DIAGNOSIS — F411 Generalized anxiety disorder: Secondary | ICD-10-CM

## 2023-01-10 MED ORDER — DESVENLAFAXINE SUCCINATE ER 25 MG PO TB24: ORAL_TABLET | ORAL | 0 refills | Status: AC

## 2023-01-10 MED ORDER — VILAZODONE HCL 10 MG PO TABS: ORAL_TABLET | ORAL | 1 refills | Status: DC

## 2023-01-10 MED ORDER — DESVENLAFAXINE SUCCINATE ER 25 MG PO TB24: ORAL_TABLET | ORAL | 0 refills | Status: DC

## 2023-01-10 MED ORDER — L-METHYLFOLATE 15 MG PO TABS: 15.0000 mg | ORAL_TABLET | Freq: Every day | ORAL | 1 refills | Status: AC

## 2023-01-10 NOTE — Patient Instructions (Addendum)
Week 1:  Pristiq 25 mg 1.5 tabs daily Vilazodone (Viibryd) 10 mg 1/2 tablet daily with breakfast  Week 2: Pristiq 25 mg 1 tablet daily   Vilazodone 10 mg 1 tablet daily with breakfast  Week 3: Pristiq 25 mg 1/2 tablet daily   Vilazodone 10 mg 1 tablet daily with breakfast  Week 4:  Stop Pristiq and continue Vilazodone 10 mg daily with breakfast

## 2023-01-10 NOTE — Progress Notes (Signed)
James Woodward [id number] 21-Apr-1990 32 y.o.  Virtual Visit via Video Note  I connected with pt @ on 01/10/23 at  8:30 AM EST by a video enabled telemedicine application and verified that I am speaking with the correct person using two identifiers.   I discussed the limitations of evaluation and management by telemedicine and the availability of in person appointments. The patient expressed understanding and agreed to proceed.  I discussed the assessment and treatment plan with the patient. The patient was provided an opportunity to ask questions and all were answered. The patient agreed with the plan and demonstrated an understanding of the instructions.   The patient was advised to call back or seek an in-person evaluation if the symptoms worsen or if the condition fails to improve as anticipated.  I provided 40 minutes of non-face-to-face time during this encounter.  The patient was located at home.  The provider was located at home.   Corie Chiquito, PMHNP   Subjective:   Patient ID:  James Woodward is a 32 y.o. (DOB 1990/09/21) male.  Chief Complaint:  Chief Complaint  Patient presents with   Medication Problem    Affective dulling   Anxiety   Depression    HPI James Woodward presents for follow-up of anxiety and depression. He reports that his anxiety has been "mostly good." He reports that his wife notices he has difficulty "accessing the wide gam mit of emotions." He reports that anger seems to be the emotion that he experiences most often. He reports that "in the past I was better able to control things." He reports that personality assessment at work indicated that he is very self-critical and he is in agreement with this. He reports that he has noticed some difficulty with motivation. He reports that he has had a harder time getting himself up to exercise- "I still do it." He reports experiencing "some anxiety on a daily basis with all the things I have going on." He reports some  worry and rumination. He denies any recent panic attacks. Denies any health-related anxiety. He reports sad mood will last days and not weeks. Sleeping well. No recent observations about him snoring. Denies difficulty falling or staying asleep. Appetite has been good. Concentration is adequate. He reports some diminished enjoyment in things. Denies SI.   Son is now 63 months old and daughter is now 28 yo. Renovation has been completed.  He will inducted into the [employer] [place] for athletics.  Past Psychiatric Medication Trials: Wellbutrin  Pristiq- Affective dulling L-methylfolate- feel some benefit from it  Review of Systems:  Review of Systems  Gastrointestinal: Negative.   Musculoskeletal:  Negative for gait problem.  Neurological:  Negative for tremors and headaches.  Psychiatric/Behavioral:         Please refer to HPI    Medications: I have reviewed the patient's current medications.  Current Outpatient Medications  Medication Sig Dispense Refill   Creatinine POWD by Does not apply route.     Multiple Vitamin (MULTIVITAMIN) LIQD Take 5 mLs by mouth daily.     Omega-3 Fatty Acids (FISH OIL) 1200 MG CAPS Take by mouth.     Vilazodone HCl (VIIBRYD) 10 MG TABS Take 1/2 tablet daily with breakfast for one week, then increase to 1 tablet daily with breakfast 30 tablet 1   desvenlafaxine (PRISTIQ) 25 MG 24 hr tablet Take 1.5 tablets daily for one week, then decrease to 1 tablet daily for one week, then 1/2  tablet daily for one week, then stop 30 tablet 0   L-Methylfolate 15 MG TABS Take 1 tablet (15 mg total) by mouth daily. 90 tablet 1   No current facility-administered medications for this visit.    Medication Side Effects: Other: Affective dulling  Allergies:  Allergies  Allergen Reactions   Penicillins     Past Medical History:  Diagnosis Date   Deviated nasal septum     Family History  Problem Relation Age of Onset   OCD Mother    Depression Mother     Snoring Father    Anxiety disorder Maternal Aunt    Snoring Maternal Uncle    Sleep apnea Maternal Uncle     Social History   Socioeconomic History   Marital status: Married    Spouse name: Not on file   Number of children: Not on file   Years of education: Not on file   Highest education level: Not on file  Occupational History   Not on file  Tobacco Use   Smoking status: Never   Smokeless tobacco: Never  Vaping Use   Vaping status: Never Used  Substance and Sexual Activity   Alcohol use: Not Currently   Drug use: Never   Sexual activity: Yes    Partners: Female    Birth control/protection: Condom  Other Topics Concern   Not on file  Social History Narrative   Not on file   Social Determinants of Health   Financial Resource Strain: Not on file  Food Insecurity: Not on file  Transportation Needs: Not on file  Physical Activity: Not on file  Stress: Not on file  Social Connections: Not on file  Intimate Partner Violence: Not on file    Past Medical History, Surgical history, Social history, and Family history were reviewed and updated as appropriate.   Please see review of systems for further details on the patient's review from today.   Objective:   Physical Exam:  There were no vitals taken for this visit.  Physical Exam Neurological:     Mental Status: He is alert and oriented to person, place, and time.     Cranial Nerves: No dysarthria.  Psychiatric:        Attention and Perception: Attention and perception normal.        Speech: Speech normal.        Behavior: Behavior is cooperative.        Thought Content: Thought content normal. Thought content is not paranoid or delusional. Thought content does not include homicidal or suicidal ideation. Thought content does not include homicidal or suicidal plan.        Cognition and Memory: Cognition and memory normal.        Judgment: Judgment normal.     Comments: Insight intact Mood is mildly anxious  and depressed     Lab Review:  No results found for: "NA", "K", "CL", "CO2", "GLUCOSE", "BUN", "CREATININE", "CALCIUM", "PROT", "ALBUMIN", "AST", "ALT", "ALKPHOS", "BILITOT", "GFRNONAA", "GFRAA"  No results found for: "WBC", "RBC", "HGB", "HCT", "PLT", "MCV", "MCH", "MCHC", "RDW", "LYMPHSABS", "MONOABS", "EOSABS", "BASOSABS"  No results found for: "POCLITH", "LITHIUM"   No results found for: "PHENYTOIN", "PHENOBARB", "VALPROATE", "CBMZ"   .res Assessment: Plan:    42 minutes spent dedicated to the care of this patient on the date of this encounter to include pre-visit review of records, ordering of medication, post visit documentation, and face-to-face time with the patient discussing side effect of affective dulling and some continued anxiety  and depression with Pristiq, as well as alternative treatment options. Discussed potential benefits, risks, and side effects of Viibryd. Patient agrees to trial of Viibryd. Will start Viibryd 10 mg 1/2 tablet daily with breakfast for one week, then increase to 10 mg daily with breakfast.  Discussed cross-titration of medications to minimize risk of discontinuation and possible side effects. Will gradually decrease Pristiq dose by 12.5 mg daily each week until stopped. Reviewed discontinuation symptoms and advised pt to contact office if he experiences severe discontinuation symptoms and titration can be slowed.  Continue L-Methylfolate 15 mg daily.  Pt to follow-up with this provider in 6 weeks or sooner if clinically indicated.  Patient advised to contact office with any questions, adverse effects, or acute worsening in signs and symptoms.   James "Stanford Breed" was seen today for medication problem, anxiety and depression.  Diagnoses and all orders for this visit:  Mild episode of recurrent major depressive disorder (HCC) -     Discontinue: desvenlafaxine (PRISTIQ) 25 MG 24 hr tablet; Take 1.5 tablets daily for one week, then decrease to 1 tablet daily  for one week, then 1/2 tablet daily -     Vilazodone HCl (VIIBRYD) 10 MG TABS; Take 1/2 tablet daily with breakfast for one week, then increase to 1 tablet daily with breakfast -     L-Methylfolate 15 MG TABS; Take 1 tablet (15 mg total) by mouth daily. -     desvenlafaxine (PRISTIQ) 25 MG 24 hr tablet; Take 1.5 tablets daily for one week, then decrease to 1 tablet daily for one week, then 1/2 tablet daily for one week, then stop  Generalized anxiety disorder -     Discontinue: desvenlafaxine (PRISTIQ) 25 MG 24 hr tablet; Take 1.5 tablets daily for one week, then decrease to 1 tablet daily for one week, then 1/2 tablet daily -     Vilazodone HCl (VIIBRYD) 10 MG TABS; Take 1/2 tablet daily with breakfast for one week, then increase to 1 tablet daily with breakfast -     desvenlafaxine (PRISTIQ) 25 MG 24 hr tablet; Take 1.5 tablets daily for one week, then decrease to 1 tablet daily for one week, then 1/2 tablet daily for one week, then stop     Please see After Visit Summary for patient specific instructions.  No future appointments.   No orders of the defined types were placed in this encounter.     -------------------------------

## 2023-03-16 ENCOUNTER — Telehealth: Payer: Self-pay | Admitting: Psychiatry

## 2023-03-16 DIAGNOSIS — F330 Major depressive disorder, recurrent, mild: Secondary | ICD-10-CM

## 2023-03-16 DIAGNOSIS — F411 Generalized anxiety disorder: Secondary | ICD-10-CM

## 2023-03-16 MED ORDER — VILAZODONE HCL 10 MG PO TABS: ORAL_TABLET | ORAL | 0 refills | Status: AC

## 2023-03-16 NOTE — Telephone Encounter (Signed)
A 30-day supply of Viibryd was sent to requested pharmacy. He will follow with [name].

## 2023-03-16 NOTE — Telephone Encounter (Signed)
PT called and said that he was going to follow [name] but he needs a refill on his viibryd 10 mg. He takes 1 a day. Pharmacy is [facility] on [address]

## 2023-04-17 ENCOUNTER — Other Ambulatory Visit: Payer: Self-pay | Admitting: Psychiatry

## 2023-04-17 DIAGNOSIS — F330 Major depressive disorder, recurrent, mild: Secondary | ICD-10-CM

## 2023-04-17 DIAGNOSIS — F411 Generalized anxiety disorder: Secondary | ICD-10-CM

## 2023-04-17 NOTE — Telephone Encounter (Signed)
 Check to see when he has an appt with [name]. If he has already seen her you can refuse. If he has an appt can send in enough to appt.  Don't count days, just send in a 30-day as needed.

## 2023-04-17 NOTE — Telephone Encounter (Signed)
 Lvm to rc

## 2023-04-18 NOTE — Telephone Encounter (Signed)
 Lvm to rc second attempt.
